# Patient Record
Sex: Female | Born: 1935 | Race: White | Hispanic: No
Health system: Southern US, Community
[De-identification: ages and names within clinical notes are randomized; demographics above are authoritative.]

---

## 2004-10-24 ENCOUNTER — Ambulatory Visit: Payer: Self-pay | Admitting: Internal Medicine

## 2005-12-25 ENCOUNTER — Ambulatory Visit: Payer: Self-pay | Admitting: Internal Medicine

## 2007-01-21 ENCOUNTER — Ambulatory Visit: Payer: Self-pay | Admitting: Internal Medicine

## 2007-02-15 ENCOUNTER — Other Ambulatory Visit: Payer: Self-pay

## 2007-02-15 ENCOUNTER — Inpatient Hospital Stay: Payer: Self-pay | Admitting: Internal Medicine

## 2008-01-23 ENCOUNTER — Ambulatory Visit: Payer: Self-pay | Admitting: Internal Medicine

## 2008-06-16 ENCOUNTER — Ambulatory Visit: Payer: Self-pay | Admitting: Ophthalmology

## 2008-12-28 ENCOUNTER — Ambulatory Visit: Payer: Self-pay | Admitting: Ophthalmology

## 2009-01-24 ENCOUNTER — Ambulatory Visit: Payer: Self-pay | Admitting: Internal Medicine

## 2009-06-07 ENCOUNTER — Ambulatory Visit: Payer: Self-pay | Admitting: Internal Medicine

## 2009-06-08 ENCOUNTER — Ambulatory Visit: Payer: Self-pay | Admitting: Internal Medicine

## 2010-02-16 ENCOUNTER — Ambulatory Visit: Payer: Self-pay | Admitting: Internal Medicine

## 2011-02-19 ENCOUNTER — Ambulatory Visit: Payer: Self-pay | Admitting: Internal Medicine

## 2012-02-20 ENCOUNTER — Ambulatory Visit: Payer: Self-pay | Admitting: Internal Medicine

## 2012-07-08 ENCOUNTER — Ambulatory Visit: Payer: Self-pay

## 2012-07-09 ENCOUNTER — Ambulatory Visit: Payer: Self-pay | Admitting: Oncology

## 2012-07-10 ENCOUNTER — Ambulatory Visit: Payer: Self-pay | Admitting: Oncology

## 2012-07-10 LAB — CBC CANCER CENTER
Eosinophil %: 1.4 %
HGB: 11.6 g/dL — ABNORMAL LOW (ref 12.0–16.0)
Lymphocyte #: 1.4 x10 3/mm (ref 1.0–3.6)
Lymphocyte %: 13.7 %
MCV: 94 fL (ref 80–100)
Monocyte %: 12.6 %
Platelet: 286 x10 3/mm (ref 150–440)
RBC: 3.82 10*6/uL (ref 3.80–5.20)
RDW: 13.2 % (ref 11.5–14.5)
WBC: 10.2 x10 3/mm (ref 3.6–11.0)

## 2012-07-10 LAB — COMPREHENSIVE METABOLIC PANEL
Alkaline Phosphatase: 160 U/L — ABNORMAL HIGH (ref 50–136)
BUN: 15 mg/dL (ref 7–18)
Bilirubin,Total: 0.3 mg/dL (ref 0.2–1.0)
Chloride: 106 mmol/L (ref 98–107)
Co2: 24 mmol/L (ref 21–32)
Creatinine: 0.94 mg/dL (ref 0.60–1.30)
EGFR (African American): >60
EGFR (Non-African Amer.): 55 — ABNORMAL LOW
Osmolality: 282 (ref 275–301)
Potassium: 3.8 mmol/L (ref 3.5–5.1)
SGOT(AST): 10 U/L — ABNORMAL LOW (ref 15–37)
SGPT (ALT): 14 U/L (ref 12–78)

## 2012-07-11 ENCOUNTER — Ambulatory Visit: Payer: Self-pay | Admitting: Oncology

## 2012-07-24 ENCOUNTER — Ambulatory Visit: Payer: Self-pay | Admitting: Urology

## 2012-07-29 ENCOUNTER — Ambulatory Visit: Payer: Self-pay | Admitting: Urology

## 2012-08-10 ENCOUNTER — Ambulatory Visit: Payer: Self-pay | Admitting: Oncology

## 2012-08-13 LAB — CBC CANCER CENTER
Basophil %: 0.3 %
Eosinophil %: 0.4 %
HCT: 35.7 % (ref 35.0–47.0)
HGB: 11.9 g/dL — ABNORMAL LOW (ref 12.0–16.0)
Lymphocyte #: 0.9 x10 3/mm — ABNORMAL LOW (ref 1.0–3.6)
MCHC: 33.2 g/dL (ref 32.0–36.0)
MCV: 92 fL (ref 80–100)
Monocyte %: 12.6 %
Neutrophil #: 8.4 x10 3/mm — ABNORMAL HIGH (ref 1.4–6.5)
RBC: 3.88 10*6/uL (ref 3.80–5.20)
WBC: 10.8 x10 3/mm (ref 3.6–11.0)

## 2012-08-13 LAB — COMPREHENSIVE METABOLIC PANEL
Albumin: 2.8 g/dL — ABNORMAL LOW (ref 3.4–5.0)
Alkaline Phosphatase: 145 U/L — ABNORMAL HIGH (ref 50–136)
Anion Gap: 11 (ref 7–16)
Calcium, Total: 9 mg/dL (ref 8.5–10.1)
Chloride: 102 mmol/L (ref 98–107)
Creatinine: 1.03 mg/dL (ref 0.60–1.30)
Glucose: 118 mg/dL — ABNORMAL HIGH (ref 65–99)
SGOT(AST): 30 U/L (ref 15–37)
SGPT (ALT): 36 U/L (ref 12–78)

## 2012-08-20 LAB — CBC CANCER CENTER
Basophil #: 0 x10 3/mm (ref 0.0–0.1)
Eosinophil #: 0 x10 3/mm (ref 0.0–0.7)
HCT: 36.2 % (ref 35.0–47.0)
HGB: 12.2 g/dL (ref 12.0–16.0)
Lymphocyte %: 24.7 %
MCV: 91 fL (ref 80–100)
Monocyte #: 0.1 x10 3/mm — ABNORMAL LOW (ref 0.2–0.9)
Neutrophil #: 2.2 x10 3/mm (ref 1.4–6.5)
Neutrophil %: 71.8 %
RDW: 13.2 % (ref 11.5–14.5)
WBC: 3.1 x10 3/mm — ABNORMAL LOW (ref 3.6–11.0)

## 2012-08-25 LAB — URINALYSIS, COMPLETE
Bilirubin,UR: NEGATIVE
Glucose,UR: NEGATIVE mg/dL (ref 0–75)
Ph: 6 (ref 4.5–8.0)
RBC,UR: 5151 /HPF (ref 0–5)
Squamous Epithelial: 3
WBC UR: 44 /HPF (ref 0–5)

## 2012-08-25 LAB — COMPREHENSIVE METABOLIC PANEL
Albumin: 2.9 g/dL — ABNORMAL LOW (ref 3.4–5.0)
Alkaline Phosphatase: 120 U/L (ref 50–136)
Anion Gap: 12 (ref 7–16)
Bilirubin,Total: 0.4 mg/dL (ref 0.2–1.0)
Chloride: 100 mmol/L (ref 98–107)
Co2: 26 mmol/L (ref 21–32)
Creatinine: 1 mg/dL (ref 0.60–1.30)
Potassium: 4.1 mmol/L (ref 3.5–5.1)
SGOT(AST): 68 U/L — ABNORMAL HIGH (ref 15–37)
SGPT (ALT): 80 U/L — ABNORMAL HIGH (ref 12–78)
Total Protein: 7.6 g/dL (ref 6.4–8.2)

## 2012-08-25 LAB — CBC CANCER CENTER
Basophil #: 0 x10 3/mm (ref 0.0–0.1)
Basophil %: 0 %
Eosinophil #: 0 x10 3/mm (ref 0.0–0.7)
HCT: 32.5 % — ABNORMAL LOW (ref 35.0–47.0)
HGB: 11 g/dL — ABNORMAL LOW (ref 12.0–16.0)
Lymphocyte %: 74.7 %
MCH: 30.4 pg (ref 26.0–34.0)
MCHC: 33.7 g/dL (ref 32.0–36.0)
Monocyte #: 0 x10 3/mm — ABNORMAL LOW (ref 0.2–0.9)
Neutrophil #: 0.1 x10 3/mm — ABNORMAL LOW (ref 1.4–6.5)
Neutrophil %: 24.1 %
RDW: 13.2 % (ref 11.5–14.5)

## 2012-08-27 LAB — CBC CANCER CENTER
Basophil #: 0 x10 3/mm (ref 0.0–0.1)
Eosinophil #: 0 x10 3/mm (ref 0.0–0.7)
HCT: 31.7 % — ABNORMAL LOW (ref 35.0–47.0)
HGB: 10.6 g/dL — ABNORMAL LOW (ref 12.0–16.0)
MCV: 90 fL (ref 80–100)
Monocyte #: 0.1 x10 3/mm — ABNORMAL LOW (ref 0.2–0.9)
Monocyte %: 3.9 %
Neutrophil %: 73.2 %
Platelet: 8 x10 3/mm — CL (ref 150–440)
RBC: 3.51 10*6/uL — ABNORMAL LOW (ref 3.80–5.20)
RDW: 12.9 % (ref 11.5–14.5)
WBC: 2 x10 3/mm — CL (ref 3.6–11.0)

## 2012-09-01 LAB — CBC CANCER CENTER
HCT: 31.9 % — ABNORMAL LOW (ref 35.0–47.0)
Lymphocytes: 20 %
MCH: 30 pg (ref 26.0–34.0)
MCV: 89 fL (ref 80–100)
Metamyelocyte: 3 %
Monocytes: 11 %
Myelocyte: 10 %
Promyelocyte: 2 %
RBC: 3.57 10*6/uL — ABNORMAL LOW (ref 3.80–5.20)

## 2012-09-10 ENCOUNTER — Ambulatory Visit: Payer: Self-pay | Admitting: Oncology

## 2012-09-17 LAB — CBC CANCER CENTER
Basophil #: 0.1 x10 3/mm (ref 0.0–0.1)
Eosinophil %: 1.1 %
HCT: 34.3 % — ABNORMAL LOW (ref 35.0–47.0)
Lymphocyte #: 1.4 x10 3/mm (ref 1.0–3.6)
Lymphocyte %: 13.7 %
MCH: 30.6 pg (ref 26.0–34.0)
MCV: 93 fL (ref 80–100)
Monocyte %: 19.3 %
Neutrophil %: 64.9 %
Platelet: 388 x10 3/mm (ref 150–440)
RBC: 3.69 10*6/uL — ABNORMAL LOW (ref 3.80–5.20)
RDW: 17.5 % — ABNORMAL HIGH (ref 11.5–14.5)
WBC: 9.9 x10 3/mm (ref 3.6–11.0)

## 2012-09-17 LAB — COMPREHENSIVE METABOLIC PANEL
Alkaline Phosphatase: 134 U/L (ref 50–136)
Anion Gap: 12 (ref 7–16)
BUN: 15 mg/dL (ref 7–18)
Bilirubin,Total: 0.2 mg/dL (ref 0.2–1.0)
Calcium, Total: 8.8 mg/dL (ref 8.5–10.1)
Chloride: 104 mmol/L (ref 98–107)
Co2: 23 mmol/L (ref 21–32)
Creatinine: 1.18 mg/dL (ref 0.60–1.30)
EGFR (African American): 49 — ABNORMAL LOW
EGFR (Non-African Amer.): 42 — ABNORMAL LOW
Osmolality: 279 (ref 275–301)
Potassium: 4.1 mmol/L (ref 3.5–5.1)
SGOT(AST): 23 U/L (ref 15–37)
Sodium: 139 mmol/L (ref 136–145)
Total Protein: 7.2 g/dL (ref 6.4–8.2)

## 2012-09-22 ENCOUNTER — Ambulatory Visit: Payer: Self-pay | Admitting: Surgery

## 2012-09-23 ENCOUNTER — Ambulatory Visit: Payer: Self-pay | Admitting: Surgery

## 2012-09-24 LAB — CBC CANCER CENTER
Basophil #: 0 x10 3/mm (ref 0.0–0.1)
Basophil %: 1.3 %
Eosinophil %: 2.2 %
Lymphocyte #: 0.7 x10 3/mm — ABNORMAL LOW (ref 1.0–3.6)
Lymphocyte %: 41.7 %
MCV: 93 fL (ref 80–100)
Neutrophil #: 0.9 x10 3/mm — ABNORMAL LOW (ref 1.4–6.5)
Neutrophil %: 49.1 %

## 2012-10-01 LAB — CBC CANCER CENTER
Eosinophil %: 0.4 %
HCT: 27.5 % — ABNORMAL LOW (ref 35.0–47.0)
Lymphocyte #: 0.6 x10 3/mm — ABNORMAL LOW (ref 1.0–3.6)
MCH: 31.7 pg (ref 26.0–34.0)
MCHC: 34 g/dL (ref 32.0–36.0)
Monocyte %: 20.1 %
Neutrophil #: 3 x10 3/mm (ref 1.4–6.5)
Neutrophil %: 65.1 %
Platelet: 39 x10 3/mm — ABNORMAL LOW (ref 150–440)
WBC: 4.6 x10 3/mm (ref 3.6–11.0)

## 2012-10-09 LAB — COMPREHENSIVE METABOLIC PANEL
Albumin: 2.7 g/dL — ABNORMAL LOW (ref 3.4–5.0)
Alkaline Phosphatase: 148 U/L — ABNORMAL HIGH (ref 50–136)
Anion Gap: 11 (ref 7–16)
Bilirubin,Total: 0.3 mg/dL (ref 0.2–1.0)
Calcium, Total: 8.7 mg/dL (ref 8.5–10.1)
Chloride: 99 mmol/L (ref 98–107)
Creatinine: 0.89 mg/dL (ref 0.60–1.30)
EGFR (African American): >60
EGFR (Non-African Amer.): 59 — ABNORMAL LOW
Osmolality: 277 (ref 275–301)
SGPT (ALT): 23 U/L (ref 12–78)

## 2012-10-09 LAB — CBC CANCER CENTER
Basophil #: 0 x10 3/mm (ref 0.0–0.1)
Eosinophil %: 0.3 %
HCT: 29.7 % — ABNORMAL LOW (ref 35.0–47.0)
HGB: 10 g/dL — ABNORMAL LOW (ref 12.0–16.0)
Lymphocyte #: 1 x10 3/mm (ref 1.0–3.6)
Lymphocyte %: 14.1 %
MCH: 32 pg (ref 26.0–34.0)
MCHC: 33.5 g/dL (ref 32.0–36.0)
MCV: 95 fL (ref 80–100)
Monocyte %: 12.9 %
Neutrophil %: 72.1 %
RDW: 20.7 % — ABNORMAL HIGH (ref 11.5–14.5)
WBC: 7.1 x10 3/mm (ref 3.6–11.0)

## 2012-10-11 ENCOUNTER — Ambulatory Visit: Payer: Self-pay | Admitting: Oncology

## 2012-10-16 LAB — CBC CANCER CENTER
Basophil #: 0 x10 3/mm (ref 0.0–0.1)
Basophil %: 0.4 %
Eosinophil #: 0 x10 3/mm (ref 0.0–0.7)
Eosinophil %: 0.8 %
Lymphocyte #: 1.2 x10 3/mm (ref 1.0–3.6)
MCHC: 33.9 g/dL (ref 32.0–36.0)
Monocyte #: 0.5 x10 3/mm (ref 0.2–0.9)
Monocyte %: 9.5 %
Neutrophil %: 67 %
Platelet: 35 x10 3/mm — ABNORMAL LOW (ref 150–440)
RBC: 2.91 10*6/uL — ABNORMAL LOW (ref 3.80–5.20)
RDW: 21.2 % — ABNORMAL HIGH (ref 11.5–14.5)

## 2012-10-16 LAB — COMPREHENSIVE METABOLIC PANEL
Anion Gap: 13 (ref 7–16)
BUN: 13 mg/dL (ref 7–18)
Calcium, Total: 8.6 mg/dL (ref 8.5–10.1)
Chloride: 103 mmol/L (ref 98–107)
Creatinine: 0.96 mg/dL (ref 0.60–1.30)
EGFR (Non-African Amer.): 54 — ABNORMAL LOW
Glucose: 130 mg/dL — ABNORMAL HIGH (ref 65–99)
Potassium: 3.8 mmol/L (ref 3.5–5.1)
SGOT(AST): 23 U/L (ref 15–37)
SGPT (ALT): 20 U/L (ref 12–78)

## 2012-10-27 ENCOUNTER — Emergency Department: Payer: Self-pay | Admitting: Emergency Medicine

## 2012-10-27 LAB — COMPREHENSIVE METABOLIC PANEL
Albumin: 2.9 g/dL — ABNORMAL LOW (ref 3.4–5.0)
Alkaline Phosphatase: 121 U/L (ref 50–136)
BUN: 13 mg/dL (ref 7–18)
Bilirubin,Total: 0.5 mg/dL (ref 0.2–1.0)
Calcium, Total: 9 mg/dL (ref 8.5–10.1)
Chloride: 105 mmol/L (ref 98–107)
Co2: 19 mmol/L — ABNORMAL LOW (ref 21–32)
Creatinine: 0.87 mg/dL (ref 0.60–1.30)
Sodium: 137 mmol/L (ref 136–145)
Total Protein: 8 g/dL (ref 6.4–8.2)

## 2012-10-27 LAB — CBC WITH DIFFERENTIAL/PLATELET
Basophil %: 0.4 %
Eosinophil #: 0 10*3/uL (ref 0.0–0.7)
Eosinophil %: 0.3 %
MCH: 32.9 pg (ref 26.0–34.0)
MCHC: 32.7 g/dL (ref 32.0–36.0)
MCV: 101 fL — ABNORMAL HIGH (ref 80–100)
Monocyte #: 0.6 x10 3/mm (ref 0.2–0.9)
Monocyte %: 13.6 %
Platelet: 55 10*3/uL — ABNORMAL LOW (ref 150–440)
RDW: 21.7 % — ABNORMAL HIGH (ref 11.5–14.5)
WBC: 4.6 10*3/uL (ref 3.6–11.0)

## 2012-10-27 LAB — LIPASE, BLOOD: Lipase: 64 U/L — ABNORMAL LOW (ref 73–393)

## 2012-10-30 LAB — COMPREHENSIVE METABOLIC PANEL
Albumin: 2.6 g/dL — ABNORMAL LOW (ref 3.4–5.0)
Anion Gap: 14 (ref 7–16)
BUN: 15 mg/dL (ref 7–18)
Bilirubin,Total: 0.2 mg/dL (ref 0.2–1.0)
Calcium, Total: 8.6 mg/dL (ref 8.5–10.1)
Co2: 20 mmol/L — ABNORMAL LOW (ref 21–32)
Glucose: 88 mg/dL (ref 65–99)
Potassium: 3.6 mmol/L (ref 3.5–5.1)
SGOT(AST): 36 U/L (ref 15–37)
SGPT (ALT): 31 U/L (ref 12–78)
Sodium: 138 mmol/L (ref 136–145)
Total Protein: 7.1 g/dL (ref 6.4–8.2)

## 2012-10-30 LAB — CBC CANCER CENTER
Basophil %: 0.7 %
HGB: 8.9 g/dL — ABNORMAL LOW (ref 12.0–16.0)
Lymphocyte #: 1.2 x10 3/mm (ref 1.0–3.6)
Lymphocyte %: 15.5 %
MCH: 33.8 pg (ref 26.0–34.0)
MCHC: 33.7 g/dL (ref 32.0–36.0)
MCV: 100 fL (ref 80–100)
Monocyte %: 11.1 %
Neutrophil #: 5.7 x10 3/mm (ref 1.4–6.5)
Platelet: 61 x10 3/mm — ABNORMAL LOW (ref 150–440)
RBC: 2.65 10*6/uL — ABNORMAL LOW (ref 3.80–5.20)

## 2012-11-01 ENCOUNTER — Emergency Department: Payer: Self-pay | Admitting: Emergency Medicine

## 2012-11-01 LAB — CBC WITH DIFFERENTIAL/PLATELET
Basophil %: 0.6 %
HGB: 9.1 g/dL — ABNORMAL LOW (ref 12.0–16.0)
Lymphocyte %: 17 %
MCHC: 33.1 g/dL (ref 32.0–36.0)
MCV: 102 fL — ABNORMAL HIGH (ref 80–100)
Monocyte #: 0.6 x10 3/mm (ref 0.2–0.9)
Neutrophil #: 3.5 10*3/uL (ref 1.4–6.5)
Platelet: 52 10*3/uL — ABNORMAL LOW (ref 150–440)
RDW: 21.3 % — ABNORMAL HIGH (ref 11.5–14.5)
WBC: 5 10*3/uL (ref 3.6–11.0)

## 2012-11-01 LAB — COMPREHENSIVE METABOLIC PANEL
Chloride: 111 mmol/L — ABNORMAL HIGH (ref 98–107)
Co2: 21 mmol/L (ref 21–32)
Creatinine: 0.8 mg/dL (ref 0.60–1.30)
EGFR (African American): >60
Glucose: 93 mg/dL (ref 65–99)
Osmolality: 280 (ref 275–301)
Potassium: 3.6 mmol/L (ref 3.5–5.1)
Sodium: 141 mmol/L (ref 136–145)
Total Protein: 6.6 g/dL (ref 6.4–8.2)

## 2012-11-08 ENCOUNTER — Ambulatory Visit: Payer: Self-pay | Admitting: Oncology

## 2012-11-10 LAB — URINALYSIS, COMPLETE
Bacteria: NONE SEEN
Bilirubin,UR: NEGATIVE
Glucose,UR: NEGATIVE mg/dL (ref 0–75)
Nitrite: NEGATIVE
Ph: 7 (ref 4.5–8.0)
Specific Gravity: 1.013 (ref 1.003–1.030)
Squamous Epithelial: <1

## 2012-11-10 LAB — COMPREHENSIVE METABOLIC PANEL
Alkaline Phosphatase: 103 U/L (ref 50–136)
Calcium, Total: 8.2 mg/dL — ABNORMAL LOW (ref 8.5–10.1)
Chloride: 106 mmol/L (ref 98–107)
Creatinine: 0.72 mg/dL (ref 0.60–1.30)
EGFR (African American): >60
EGFR (Non-African Amer.): >60
Glucose: 91 mg/dL (ref 65–99)
Osmolality: 274 (ref 275–301)
SGOT(AST): 14 U/L — ABNORMAL LOW (ref 15–37)
SGPT (ALT): 28 U/L (ref 12–78)
Total Protein: 6.7 g/dL (ref 6.4–8.2)

## 2012-11-10 LAB — CBC
HCT: 29.3 % — ABNORMAL LOW (ref 35.0–47.0)
HGB: 9.8 g/dL — ABNORMAL LOW (ref 12.0–16.0)
MCH: 34.5 pg — ABNORMAL HIGH (ref 26.0–34.0)
MCHC: 33.4 g/dL (ref 32.0–36.0)
Platelet: 66 10*3/uL — ABNORMAL LOW (ref 150–440)
RBC: 2.83 10*6/uL — ABNORMAL LOW (ref 3.80–5.20)
RDW: 18.9 % — ABNORMAL HIGH (ref 11.5–14.5)
WBC: 7.6 10*3/uL (ref 3.6–11.0)

## 2012-11-12 LAB — TROPONIN I: Troponin-I: 0.04 ng/mL

## 2012-11-12 LAB — CBC WITH DIFFERENTIAL/PLATELET
Basophil #: 0 10*3/uL (ref 0.0–0.1)
Lymphocyte #: 1.2 10*3/uL (ref 1.0–3.6)
Lymphocyte %: 19.2 %
MCH: 34.3 pg — ABNORMAL HIGH (ref 26.0–34.0)
MCHC: 33.2 g/dL (ref 32.0–36.0)
MCV: 103 fL — ABNORMAL HIGH (ref 80–100)
Monocyte %: 11.5 %
Neutrophil %: 68.5 %
Platelet: 60 10*3/uL — ABNORMAL LOW (ref 150–440)
RBC: 2.13 10*6/uL — ABNORMAL LOW (ref 3.80–5.20)
RDW: 19.1 % — ABNORMAL HIGH (ref 11.5–14.5)

## 2012-11-12 LAB — BASIC METABOLIC PANEL
Anion Gap: 6 — ABNORMAL LOW (ref 7–16)
BUN: 15 mg/dL (ref 7–18)
Chloride: 113 mmol/L — ABNORMAL HIGH (ref 98–107)
Co2: 22 mmol/L (ref 21–32)
Creatinine: 0.87 mg/dL (ref 0.60–1.30)
Potassium: 3.8 mmol/L (ref 3.5–5.1)

## 2012-11-13 ENCOUNTER — Inpatient Hospital Stay: Payer: Self-pay | Admitting: Internal Medicine

## 2012-11-13 LAB — CBC WITH DIFFERENTIAL/PLATELET
Basophil #: 0 10*3/uL (ref 0.0–0.1)
Eosinophil #: 0 10*3/uL (ref 0.0–0.7)
Eosinophil %: 0.1 %
Lymphocyte #: 0.7 10*3/uL — ABNORMAL LOW (ref 1.0–3.6)
MCH: 33.2 pg (ref 26.0–34.0)
MCHC: 32 g/dL (ref 32.0–36.0)
MCV: 104 fL — ABNORMAL HIGH (ref 80–100)
Neutrophil #: 4.6 10*3/uL (ref 1.4–6.5)
Platelet: 53 10*3/uL — ABNORMAL LOW (ref 150–440)
RDW: 18.9 % — ABNORMAL HIGH (ref 11.5–14.5)

## 2012-11-13 LAB — BASIC METABOLIC PANEL
Anion Gap: 6 — ABNORMAL LOW (ref 7–16)
Creatinine: 0.9 mg/dL (ref 0.60–1.30)
EGFR (African American): >60
Glucose: 98 mg/dL (ref 65–99)
Osmolality: 288 (ref 275–301)
Potassium: 3.7 mmol/L (ref 3.5–5.1)

## 2012-11-14 LAB — CBC WITH DIFFERENTIAL/PLATELET
Basophil #: 0 10*3/uL (ref 0.0–0.1)
Basophil %: 0.2 %
Eosinophil #: 0 10*3/uL (ref 0.0–0.7)
Eosinophil %: 0.2 %
HCT: 31.6 % — ABNORMAL LOW (ref 35.0–47.0)
MCHC: 33.6 g/dL (ref 32.0–36.0)
Monocyte #: 1 x10 3/mm — ABNORMAL HIGH (ref 0.2–0.9)
Neutrophil #: 5.6 10*3/uL (ref 1.4–6.5)
Platelet: 55 10*3/uL — ABNORMAL LOW (ref 150–440)
RBC: 3.21 10*6/uL — ABNORMAL LOW (ref 3.80–5.20)
RDW: 19.2 % — ABNORMAL HIGH (ref 11.5–14.5)

## 2012-11-14 LAB — BASIC METABOLIC PANEL
BUN: 14 mg/dL (ref 7–18)
Calcium, Total: 7.9 mg/dL — ABNORMAL LOW (ref 8.5–10.1)
Chloride: 112 mmol/L — ABNORMAL HIGH (ref 98–107)
Co2: 20 mmol/L — ABNORMAL LOW (ref 21–32)
EGFR (African American): >60
EGFR (Non-African Amer.): >60

## 2012-11-15 LAB — BASIC METABOLIC PANEL
Co2: 26 mmol/L (ref 21–32)
Osmolality: 286 (ref 275–301)
Sodium: 143 mmol/L (ref 136–145)

## 2012-11-15 LAB — CBC WITH DIFFERENTIAL/PLATELET
Basophil %: 0.7 %
HCT: 32.7 % — ABNORMAL LOW (ref 35.0–47.0)
Lymphocyte %: 13.3 %
MCH: 34.3 pg — ABNORMAL HIGH (ref 26.0–34.0)
MCHC: 34.5 g/dL (ref 32.0–36.0)
MCV: 99 fL (ref 80–100)
Monocyte %: 11.8 %
Neutrophil #: 5.6 10*3/uL (ref 1.4–6.5)
Neutrophil %: 73.9 %
Platelet: 56 10*3/uL — ABNORMAL LOW (ref 150–440)
RBC: 3.3 10*6/uL — ABNORMAL LOW (ref 3.80–5.20)
RDW: 19.3 % — ABNORMAL HIGH (ref 11.5–14.5)
WBC: 7.6 10*3/uL (ref 3.6–11.0)

## 2012-11-18 ENCOUNTER — Ambulatory Visit: Payer: Self-pay | Admitting: Oncology

## 2012-12-09 ENCOUNTER — Ambulatory Visit: Payer: Self-pay | Admitting: Oncology

## 2012-12-09 DEATH — deceased

## 2013-06-07 IMAGING — CT CT CHEST-ABD W/ CM
1 of 3 series · 11 of 30 positions shown, 17 images · non-contrast
Comparison: none

REASON FOR EXAM: Kidney mass   pain  to include kidneys  delayed images
COMMENTS:

[Series 2: soft tissue · axial · 0.63mm/px · z∈[-550,-230]mm · 11 of 80 slices shown, 17 images]
[im 8/80  mediastinal]
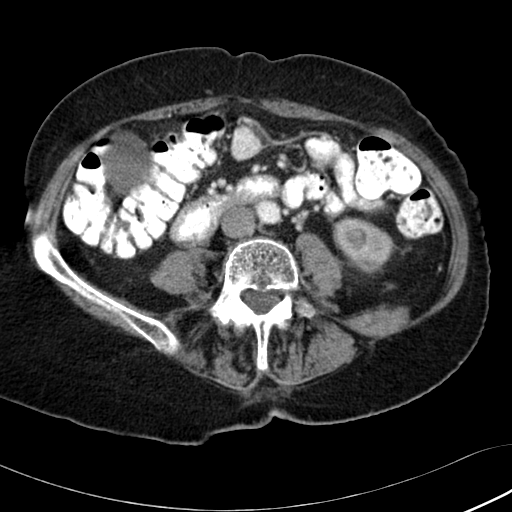
[im 8/80  bone]
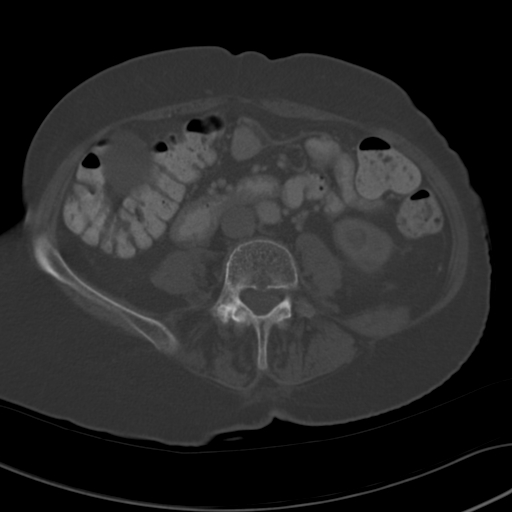
[im 15/80  mediastinal]
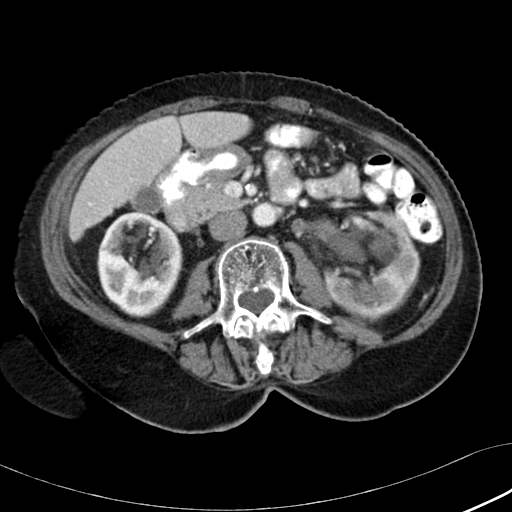
[im 22/80  mediastinal]
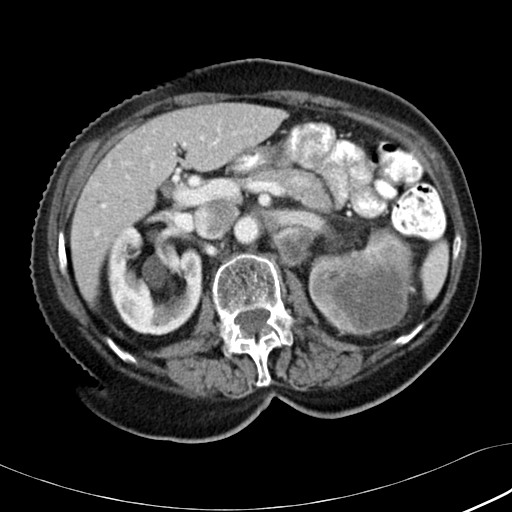
[im 29/80  mediastinal]
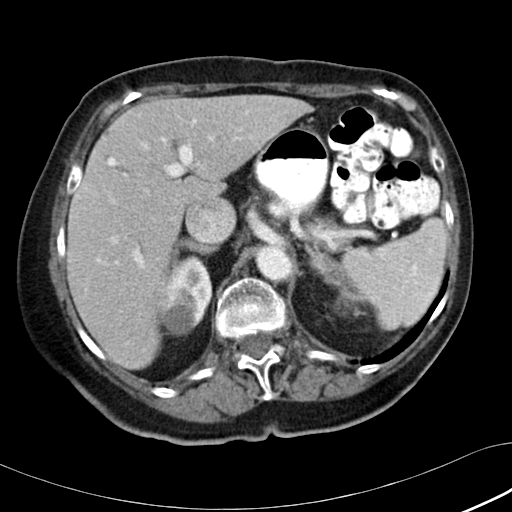
[im 36/80  mediastinal]
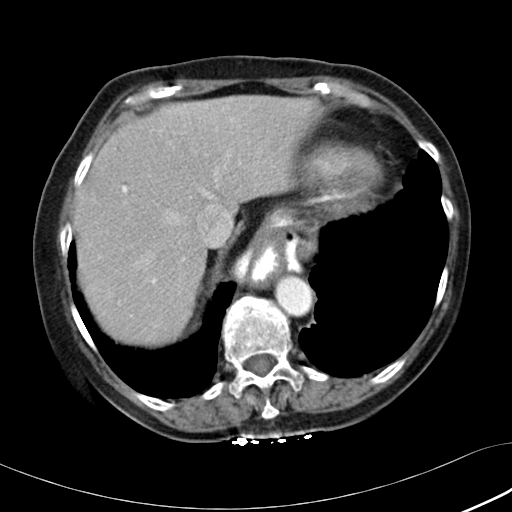
[im 39/80  mediastinal]
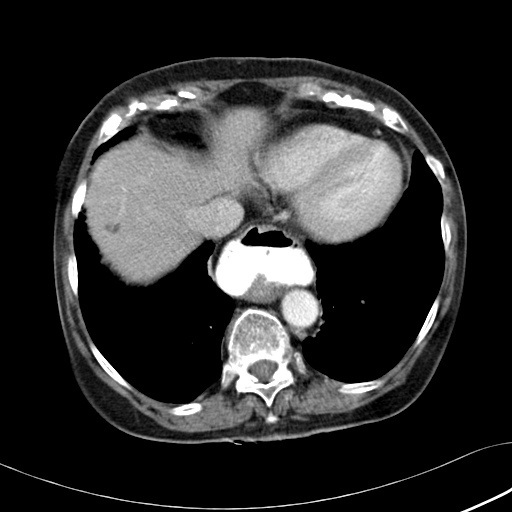
[im 44/80  mediastinal]
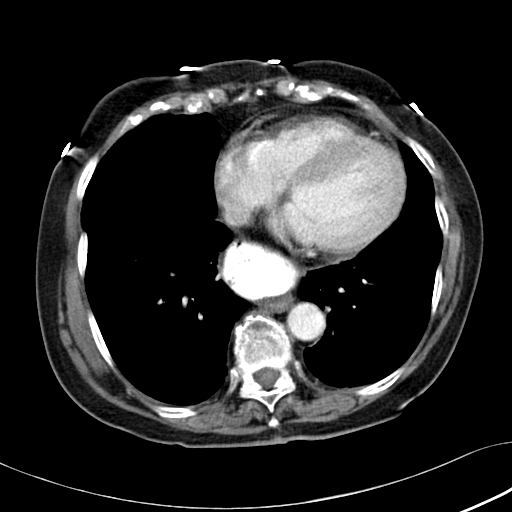
[im 51/80  mediastinal]
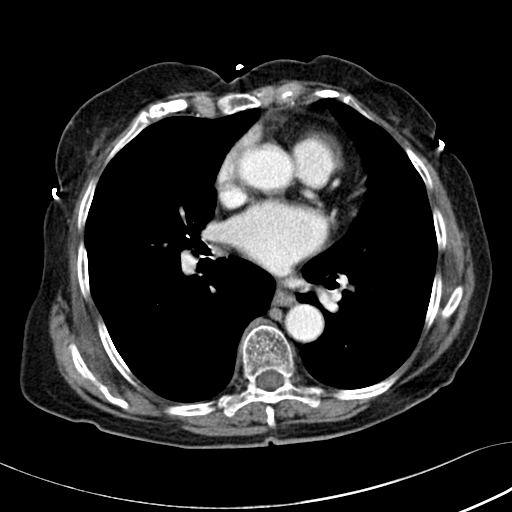
[im 51/80  lung]
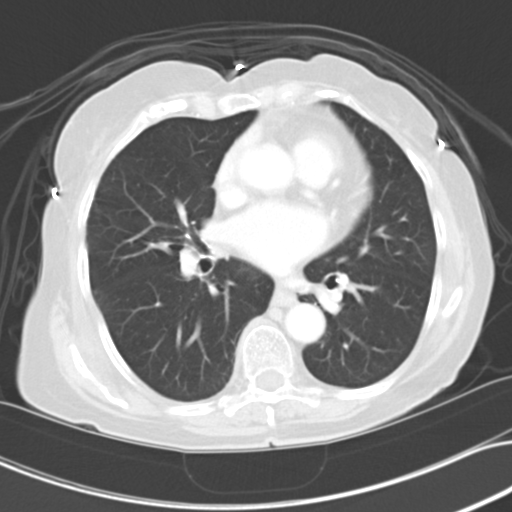
[im 58/80  mediastinal]
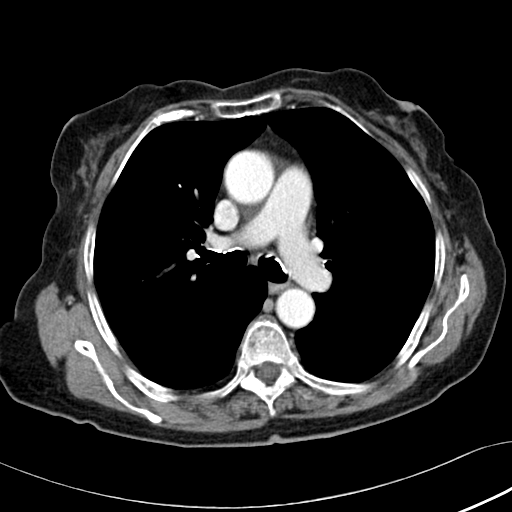
[im 58/80  lung]
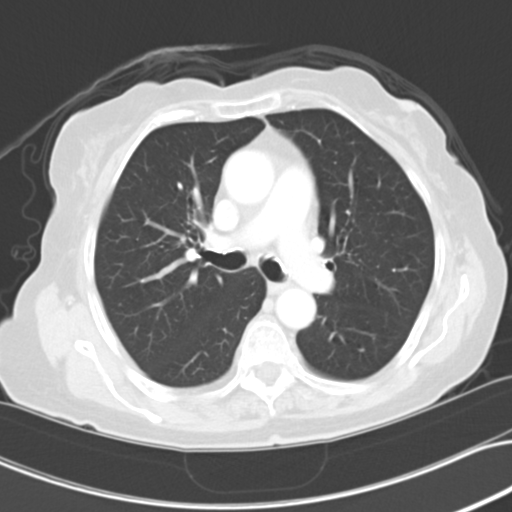
[im 58/80  bone]
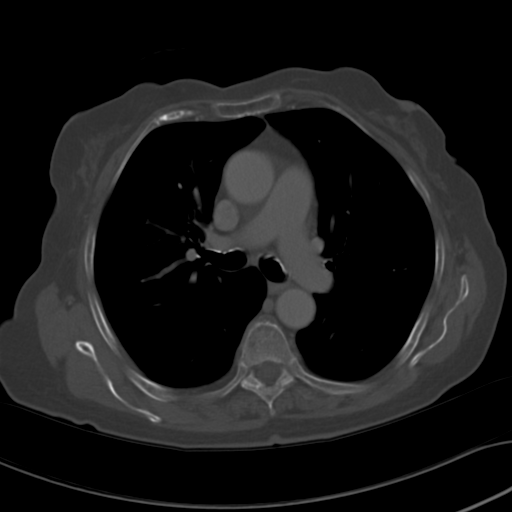
[im 65/80  mediastinal]
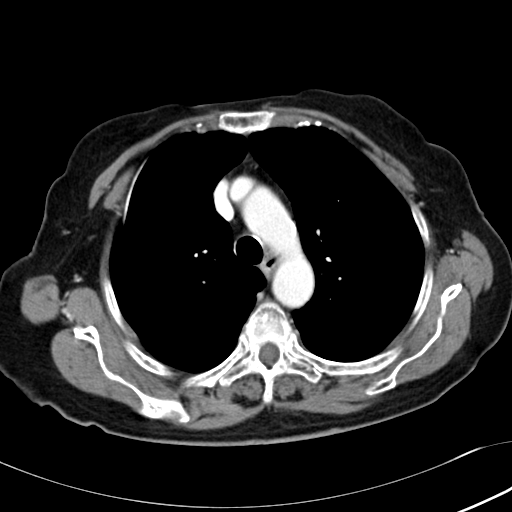
[im 65/80  lung]
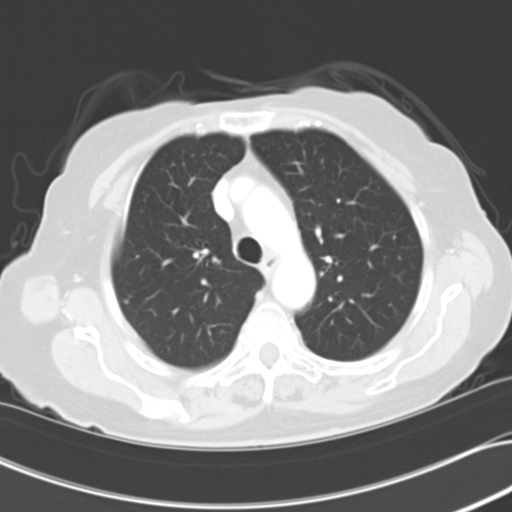
[im 72/80  mediastinal]
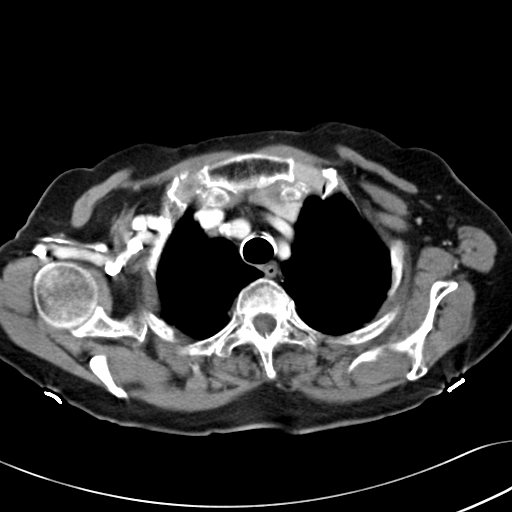
[im 72/80  lung]
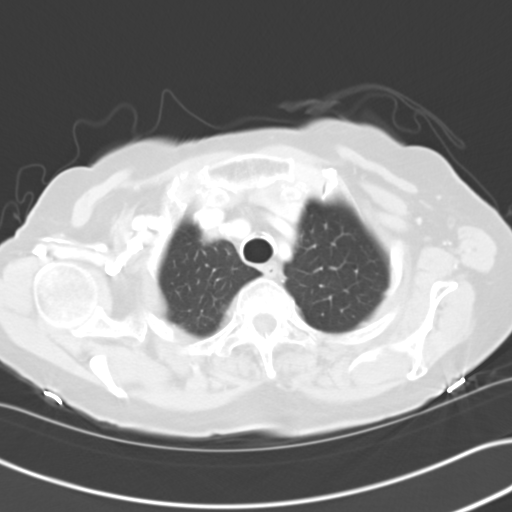

[11 of 30 positions shown; findings below may reference images not displayed]

PROCEDURE:     CT  - CT CHEST AND ABDOMEN W  - July 14, 2012  [DATE]

RESULT:     Axial CT scanning was performed through the chest and abdomen
with reconstructions at 3 mm intervals and slice thicknesses. Comparison is
made to study 08 July, 2012 which is of the abdomen only. There is
comparison is also made to study 07 June, 2009.

CT scan of the chest: The cardiac chambers are normal in size. The caliber
of the thoracic aorta is normal. There are no pathologic sized mediastinal
or hilar lymph nodes. There is a small hiatal hernia. On image 16 there is
an approximately 2 to 3 mm diameter subpleural nodule in the right upper
lobe laterally. More inferiorly in the lateral aspect of the right middle
lobe there is approximately 2 x 4 mm diameter nodule. More posteriorly
adjacent to the major fissure in the right middle lobe there is a 2 mm
diameter nodule. On the left associated with the major fissure on image 32
there is a 2 mm diameter nodule. There is no interstitial nor alveolar
infiltrate. There is a small amount of apical pleural scarring. There is a
hiatal hernia-partially intrathoracic stomach.
CONCLUSION: 1. There are multiple tiny nodules within the lungs which are nonspecific.
None exceed 3 mm in diameter. These are likely inflammatory but would will
merit followup chest CT scans.
2. There are no pathologic sized mediastinal or hilar lymph nodes.
3. There is a moderate sized hiatal hernia gas partially intrathoracic
stomach.

CT scan of the abdomen: There is a markedly abnormal appearance of the left
kidney worrisome for malignancy or less likely multifocal infection. There
is a satellite nodule inferior to the spleen on image 55 which measures
approximately 1.5 centimeters in diameter. The left kidney appears expanded.
There are some cystic changes in the parapelvic regions but a poorly defined
mid pole mass is present measuring as much as 5 cm in diameter. The right
kidney exhibits multiple parapelvic cysts. The cortical parenchyma enhances
well. There is an exophytic cyst from the upper pole of the right kidney.

There is bulky periaortic lymphadenopathy on the left. These lymph nodes
measure between 1.5 in 2.5 cm in diameter. The liver exhibits a tiny stable
hypodensity in the right lobe near the dome consistent with a cyst. The
stomach is partially intrathoracic. The spleen, pancreas, and adrenal glands
exhibit no acute abnormality. The caliber of the abdominal aorta is normal.
The of partially visualized small and large bowel exhibits no acute
abnormality.
IMPRESSION: 1. Please see the discussion above regarding findings in the thorax.
2. There is a markedly abnormal appearance of the kidney consistent with
malignancy. There is retroperitoneal lymphadenopathy and mesenteric
lymphadenopathy.
3. I do not see acute abnormality elsewhere within the abdomen.

## 2013-10-05 IMAGING — CT CT CHEST W/ CM
1 of 2 series · 14 of 32 positions shown, 18 images · non-contrast
Comparison: none

REASON FOR EXAM: tachycardia
COMMENTS:

[Series 5: lung windows · axial · 0.58mm/px · z∈[-40,+194]mm · 14 of 94 slices shown, 18 images]
[im 8/94  mediastinal]
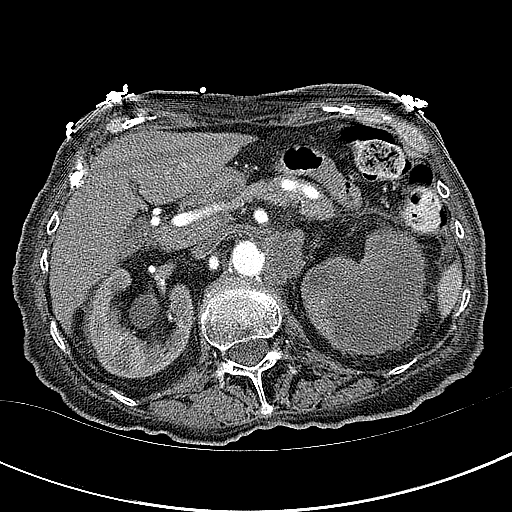
[im 8/94  lung]
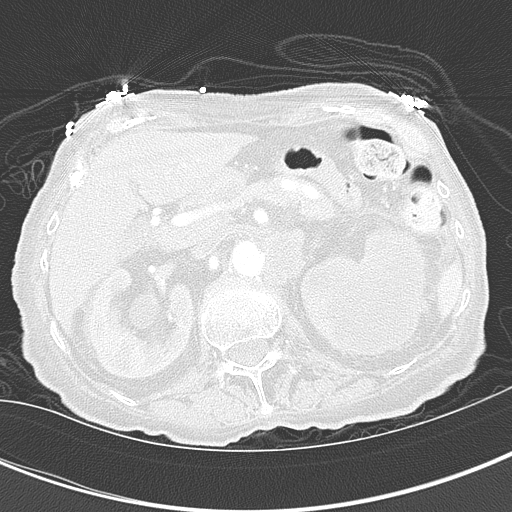
[im 15/94  lung]
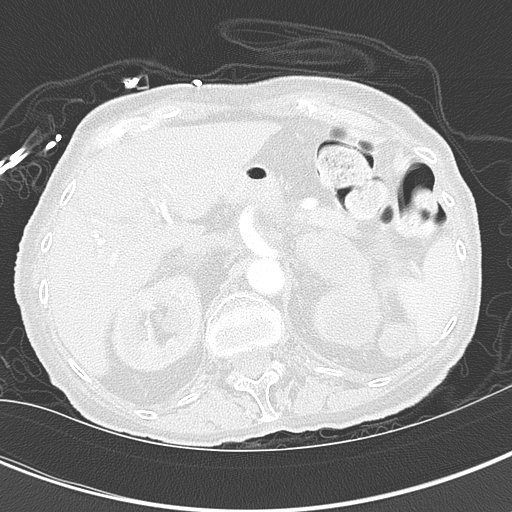
[im 22/94  lung]
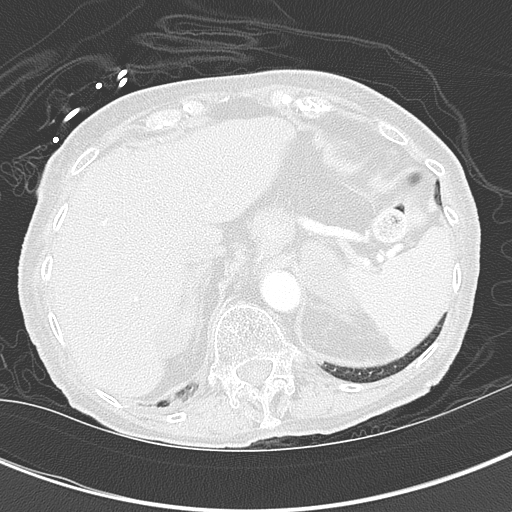
[im 29/94  lung]
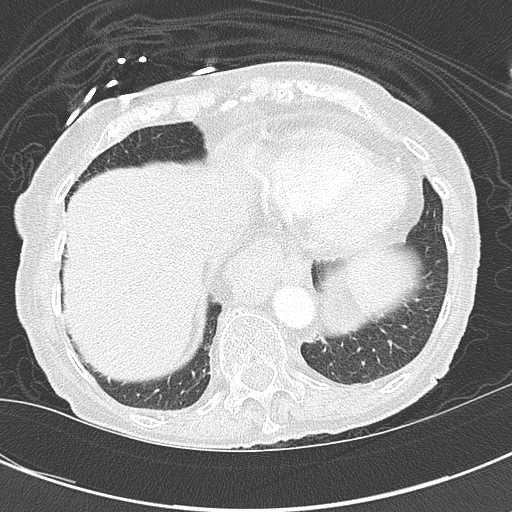
[im 36/94  mediastinal]
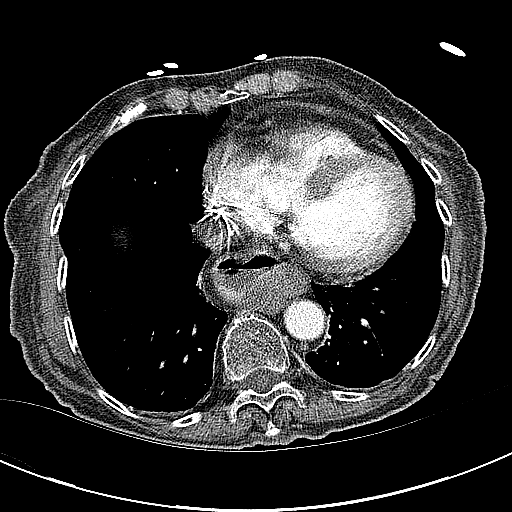
[im 36/94  lung]
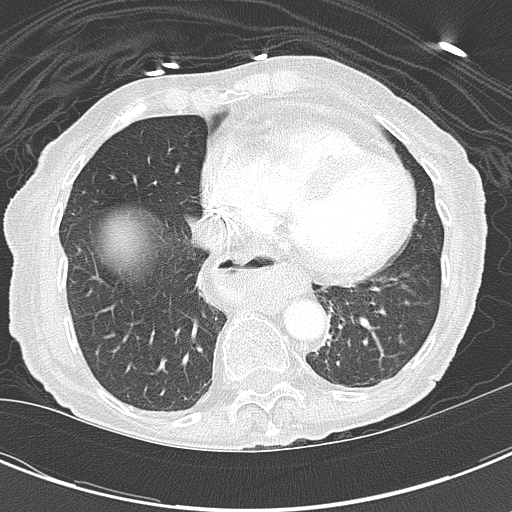
[im 43/94  lung]
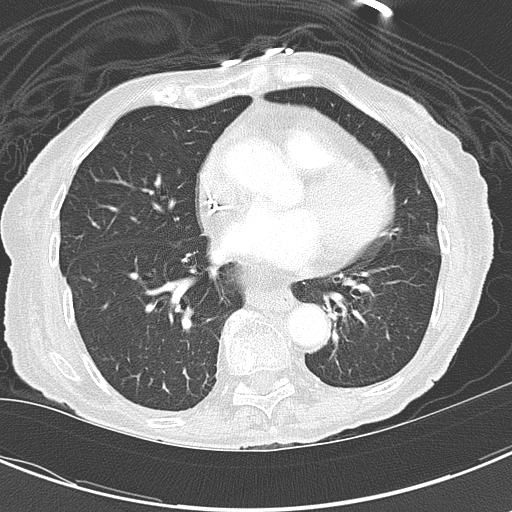
[im 44/94  lung]
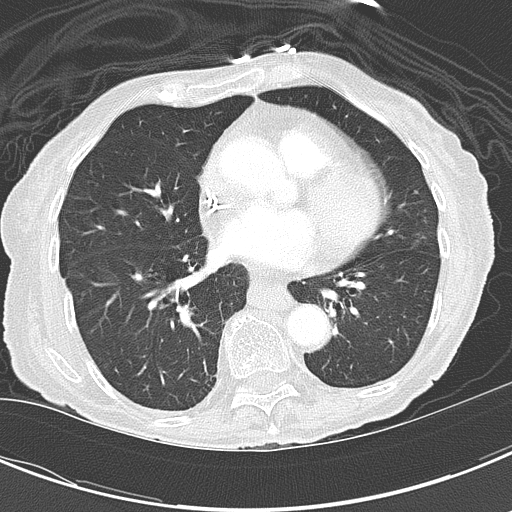
[im 47/94  lung]
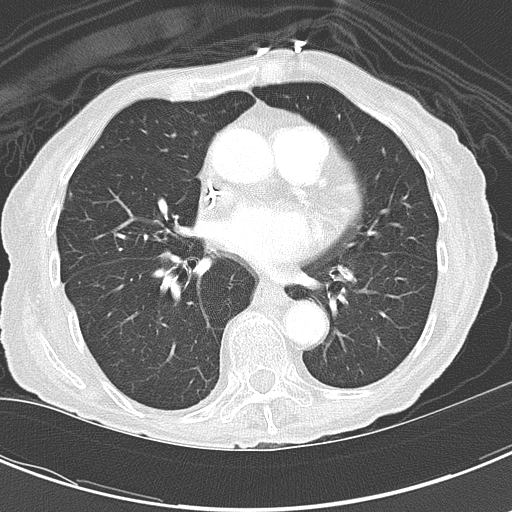
[im 51/94  mediastinal]
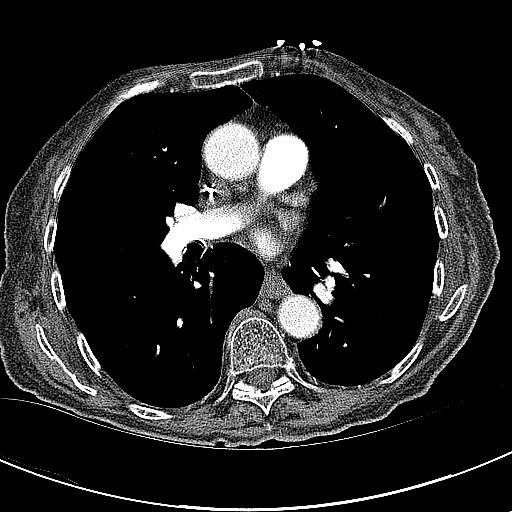
[im 51/94  lung]
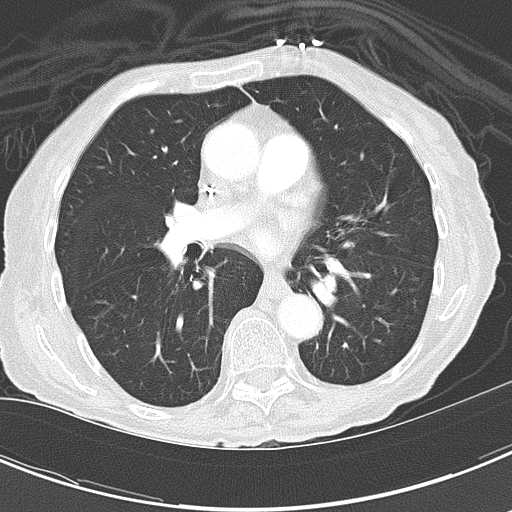
[im 58/94  lung]
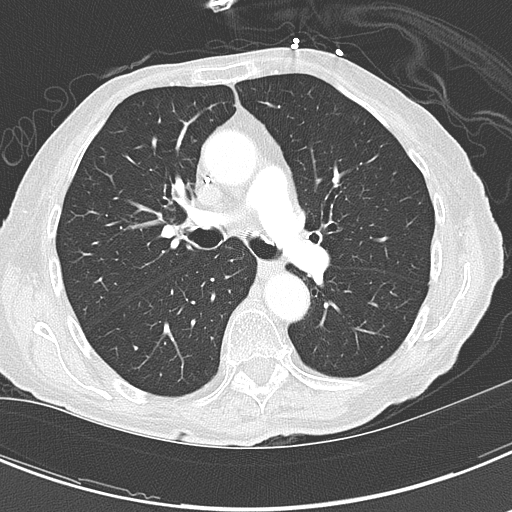
[im 65/94  lung]
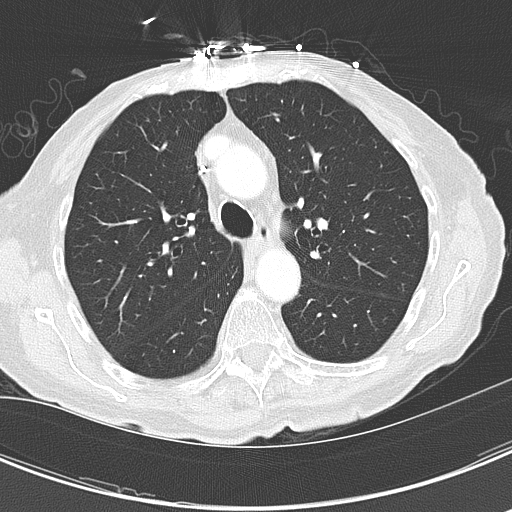
[im 72/94  lung]
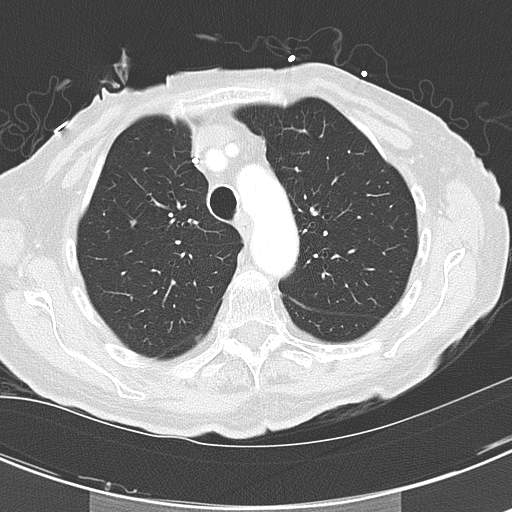
[im 79/94  mediastinal]
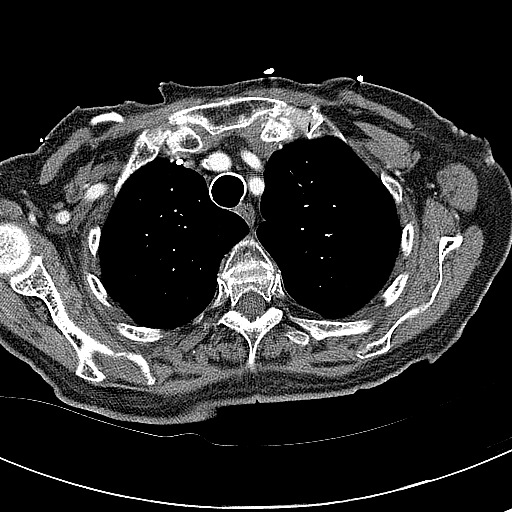
[im 79/94  lung]
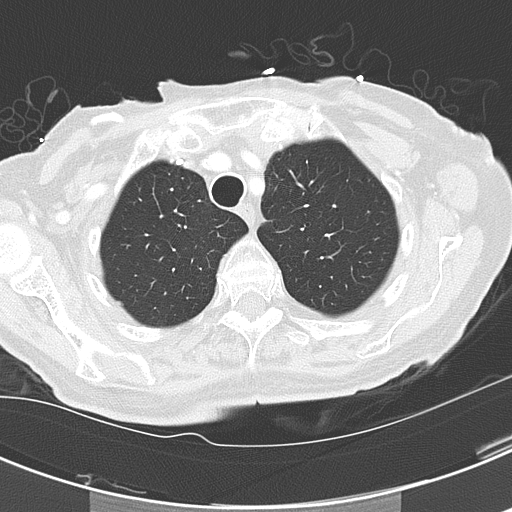
[im 86/94  lung]
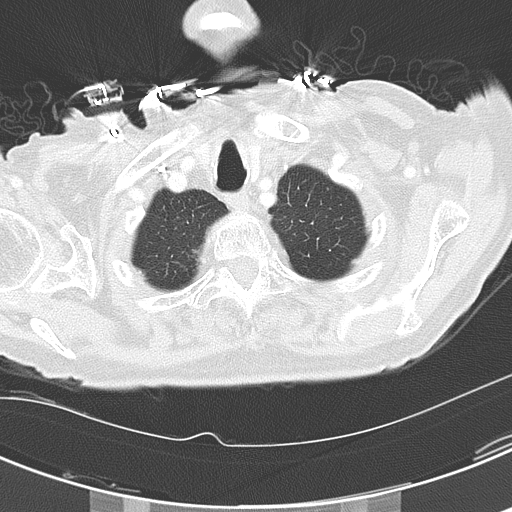

[14 of 32 positions shown; findings below may reference images not displayed]

PROCEDURE:     CT  - CT CHEST (FOR PE) W  - November 11, 2012  [DATE]

RESULT:     Chest CT is performed with 75 mL of Lsovue-0PW iodinated
intravenous contrast and reconstructed at 3 mm slice thickness in the axial
plane with comparison made to the previous exam of 14 July, 2012. There is
also a previous CT of the abdomen and pelvis from 14 July, 2012.

Severe left hydronephrosis is present with renal cortical thinning. There is
prominence of the right renal collecting system as well. This is
incompletely evaluated on the current study.

There is a moderately large hiatal hernia present. The thoracic aorta is
normal in caliber without dissection. The pulmonary arterial system appears
to opacify normally without a filling defect to suggest pulmonary embolism.
There is no pleural or pericardial effusion. There is no mediastinal or
hilar mass or adenopathy. There is no supraclavicular or axillary
adenopathy. There appear to be enlarged nodes in the left para-aortic region
surrounding the left renal artery. A Port-A-Cath device is present over the
right chest. Bony structures show no acute abnormality. Lung window images
demonstrate some minimal subpleural linear density best appreciated on image
#21. This appears to be calcified and is likely granulomatous in origin.
There is some minimal thickening along the minor fissure appreciated in the
area of image #47. The lungs are clear without a discrete mass, evidence of
edema or infiltrate. Bilateral apical fibrotic changes are present and
similar to that seen on the previous exam mentioned above.
IMPRESSION: 1. No pulmonary embolic filling defect evident.
2. Moderately large hiatal hernia.
3. Degenerative changes in the spine.
4. Probable right upper lobe granulomatous calcification. Bilateral apical
densities, likely fibrotic.
5. Left hydronephrosis that is severe and chronic. Findings concerning for
right hydronephrosis. Please see the separate CT of the abdomen and pelvis.
Probable left para-aortic adenopathy. Please see the separate dictation of
the abdomen and pelvis.

[REDACTED]

## 2014-12-28 NOTE — Op Note (Signed)
PATIENT NAME:  Jennifer Kidd, Jennifer Kidd MR#:  [id number] DATE OF BIRTH:  1925-03-23  DATE OF PROCEDURE:  07/29/2012  PREOPERATIVE DIAGNOSIS: Left renal pelvic mass.   POSTOPERATIVE DIAGNOSIS: Left renal pelvic mass.  PROCEDURE: Left ureteroscopy with biopsy, renal pelvic washings.   SURGEON: Madolyn FriezeBrian S. Achilles Dunkope, MD  ANESTHESIA: General endotracheal anesthesia.   INDICATIONS: The patient is an 81 year old white female who initially presented with gross hematuria. She has developed progressive left-sided flank pain and discomfort. A recent CT scan demonstrated what appears to be an obstructed kidney with some degree of hydronephrosis. There is an enhancing mass in the region of the left renal pelvis demonstrating obstruction. She also has several periaortic lymph nodes suspicious for metastatic disease. She presents for ureteroscopy and biopsy.   DESCRIPTION OF PROCEDURE: After informed consent was obtained, the patient was taken to the Operating Room and placed in the dorsal lithotomy position under general endotracheal anesthesia. The patient was then prepped and draped in the usual standard fashion. The 22 French rigid cystoscope was introduced into the urethra under direct vision with no urethral abnormalities noted. Upon entering the bladder, the mucosa was inspected in its entirety with no gross mucosal lesions noted. Some blood was noted layering on the bladder base. This was irrigated free with no lesions noted. Bilateral ureteral orifices were well visualized with no lesions noted. A flexible tip Glidewire was introduced into the left ureteral orifice. A 6 French open-ended catheter was advanced over the guidewire into the mid ureter. A retrograde pyelogram was performed demonstrating a large filling defect throughout the entire renal pelvis. The outer calyces were minimally visualized. The guidewire was readvanced through the open-ended catheter. The open-ended catheter was then removed. The cystoscope was  removed. The 6 French rigid ureteroscope was advanced into the urinary bladder. It was advanced into the left ureteral orifice and to the proximal left ureter under direct visualization at the level of the UPJ. A large papillary-appearing tumor was noted extending into the proximal ureteral area. A four prong Segura Nitinol basket was utilized to lasso a portion of the tumor. This was removed in its entirety. Further visualization demonstrated a large tumor filling the entire renal pelvis. The scope was unable to be advanced far beyond the upper outer aspect of the renal pelvis due to the degree of tumor. Minimal bleeding was encountered. Two additional biopsies with the basket were undertaken each with sizable portions of tumor and all appears to be papillary in nature consistent with transitional cell carcinoma. Washings were obtained from the renal pelvis. Several fragments of tissue were also noted within the urinary bladder after withdrawal of the scope. These were irrigated free and collected and will be sent with the pathology biopsy. The bladder was drained. The cystoscope was removed. The patient was returned to the supine position and awakened from general endotracheal anesthesia. She was taken to the recovery room in stable condition. There were no problems or complications. Patient tolerated procedure well.    ____________________________ Madolyn FriezeBrian S. Achilles Dunkope, MD bsc:cms D: 07/29/2012 09:44:41 ET T: 07/29/2012 10:02:50 ET JOB#: [id number]  cc: Madolyn FriezeBrian S. Achilles Dunkope, MD, <Dictator> Madolyn FriezeBRIAN S Lorene Klimas MD ELECTRONICALLY SIGNED 07/31/2012 21:45

## 2014-12-31 NOTE — Consult Note (Signed)
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  Megace has helpedwith the appetiteis under better controlhas persistent thrombocytopeni. No nausea.  No vomiting.  No diarrhea.  Ambulations remains limitedwas admitted in the hospital with uncontrolled pain.  Because of thrombocytopenia chemotherapy. being held  [facility]:  Comments Mother had ovarian cancer.   Father had cardiovascular disease   Social History negative alcohol, negative tobacco   Additional Past Medical and Surgical History As mentioned above.   History of hypertension  Angina.  Partial hysterectomy   Review of Systems:  General weakness  pain   Performance Status (ECOG) 3   HEENT no complaints   Lungs SOB   Cardiac no complaints   GI nausea  poor appetite   GU difficulty urinating   Musculoskeletal back pain   Extremities Swelling of her lower extremity   Skin no complaints   Neuro no complaints   Endocrine no complaints   Psych no complaints   NURSING NOTES: **Vital Signs.:   04-Mar-14 14:18   Vital Signs Type: Routine   Temperature Temperature (F): 98.4   Celsius: 36.8   Temperature Source: oral   Pulse Pulse: 78   Respirations Respirations: 18   Systolic BP Systolic BP: 092   Diastolic BP (mmHg) Diastolic BP (mmHg): 67   Mean BP: 86   Pulse Ox % Pulse Ox %: 100   Pulse Ox Activity Level: At rest   Oxygen Delivery: Room Air/ 21 %   Physical Exam:  General Patient is much more comfortable this afternoon   HEENT: normal   Lungs: crepitations   Cardiac: irregular rate, rhythm   Breast: not examined   Abdomen: soft  tender   Skin: intact   Extremities: edema   Neuro: AAOx3   Psych: alert and cooperative   Physical Exam LYMPHATICS:   No cervical,  axillary, or inguinal lymphadenopathy     Renal Cancer:    htn:    angina:    Cataract Extraction:    Appendectomy:    Hysterectomy - Partial:    Shellfish: Chest Tightness    oxycodone 5 mg oral capsule: 1-2  orally every 4 hours as needed  FOR PAIN, Status: Active, Quantity: 25, Refills: None   predniSONE 5 mg oral tablet: 12 tab(s) orally once a day, then decrease by 0.5 tablet daily until all the tablets are taken, Status: Active, Quantity: 20, Refills: None   clorazepate 7.5 mg oral tablet: 1 tab(s) orally every 12 hours, As Needed- for Anxiety, Nervousness , Status: Active, Quantity: 0, Refills: None   omeprazole 20 mg oral delayed release capsule: 1 cap(s) orally 2 times a day, Status: Active, Quantity: 0, Refills: None   Dilantin 100 mg oral capsule, extended release: 3 cap(s) orally once a day (at bedtime), Status: Active, Quantity: 90, Refills: None   metoprolol tartrate 100 mg oral tablet: 1 tab(s) orally 2 times a day, Status: Active, Quantity: 30, Refills: None   multivitamin: 1 tab(s) orally once a day, Status: Active, Quantity: 0, Refills: None   calcium carbonate 600 mg oral tablet: 1 tab(s) orally once a day, Status: Active, Quantity: 0, Refills: None   latanoprost ophthalmic 0.005% ophthalmic solution: 1 drop(s) to right eye once a day (at bedtime), Status: Active, Quantity: 0, Refills: None   aspirin 81 mg oral tablet: 1 tab(s) orally once  a day, Status: Active, Quantity: 0, Refills: None   fentanyl 50 mcg/hr transdermal film, extended release: 1 patch transdermal every 72 hours as directed for pain, Status: Active, Quantity: 0, Refills: None   ondansetron 4 mg oral tablet: 1 tab(s) orally every 4 to 6 hours, As Needed for nausea, Status: Active, Quantity: 0, Refills: None   megestrol 40 mg/mL oral suspension: 20 milliliter(s) orally once a day, Status: Active, Quantity: 600, Refills: None  Laboratory Results: Hepatic:  03-Mar-14 19:46   Bilirubin,  Total 0.4  Alkaline Phosphatase 103  SGPT (ALT) 28  SGOT (AST)  14  Total Protein, Serum 6.7  Albumin, Serum  2.5  Routine Chem:  03-Mar-14 19:46   Glucose, Serum 91  BUN 14  Creatinine (comp) 0.72  Sodium, Serum 137  Potassium, Serum 4.1  Chloride, Serum 106  CO2, Serum  20  Calcium (Total), Serum  8.2  Osmolality (calc) 274  eGFR (African American) >60  eGFR (Non-African American) >60 (eGFR values <35mL/min/1.73 m2 may be an indication of chronic kidney disease (CKD). Calculated eGFR is useful in patients with stable renal function. The eGFR calculation will not be reliable in acutely ill patients when serum creatinine is changing rapidly. It is not useful in  patients on dialysis. The eGFR calculation may not be applicable to patients at the low and high extremes of body sizes, pregnant women, and vegetarians.)  Anion Gap 11  Cardiac:  04-Mar-14 15:22   Troponin I 0.04 (0.00-0.05 0.05 ng/mL or less: NEGATIVE  Repeat testing in 3-6 hrs  if clinically indicated. >0.05 ng/mL: POTENTIAL  MYOCARDIAL INJURY. Repeat  testing in 3-6 hrs if  clinically indicated. NOTE: An increase or decrease  of 30% or more on serial  testing suggests a  clinically important change)  Routine UA:  03-Mar-14 21:44   Color (UA) Yellow  Clarity (UA) Hazy  Glucose (UA) Negative  Bilirubin (UA) Negative  Ketones (UA) 1+  Specific Gravity (UA) 1.013  Blood (UA) 3+  pH (UA) 7.0  Protein (UA) Negative  Nitrite (UA) Negative  Leukocyte Esterase (UA) Negative (Result(s) reported on 10 Nov 2012 at 10:16PM.)  RBC (UA) 140 /HPF  WBC (UA) 8 /HPF  Bacteria (UA) NONE SEEN  Epithelial Cells (UA) <1 /HPF (Result(s) reported on 10 Nov 2012 at 10:16PM.)  Routine Hem:  03-Mar-14 18:02   WBC (CBC) 7.6  RBC (CBC)  2.83  Hemoglobin (CBC)  9.8  Hematocrit (CBC)  29.3  Platelet Count (CBC)  66 (Result(s) reported on 10 Nov 2012 at 07:44PM.)  MCV  103  MCH  34.5  MCHC 33.4  RDW  18.9   Assessment  and Plan: Impression:   1.-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

## 2014-12-31 NOTE — Op Note (Signed)
PATIENT NAME:  Jennifer Kidd, Jennifer Kidd MR#:  [id number] DATE OF BIRTH:  08-03-1925  DATE OF PROCEDURE:  09/23/2012  PREOPERATIVE DIAGNOSIS: Kidney cancer.   POSTOPERATIVE DIAGNOSIS: Kidney cancer.   PROCEDURE PERFORMED: Insertion of central venous catheter with subcutaneous infusion port.   SURGEON: Renda RollsWilton Morocco Gipe, MD  ANESTHESIA: Local 1% Xylocaine with intravenous sedation and monitored anesthesia care.   INDICATIONS: This 81 year old female has a diagnosis of kidney cancer now needing central venous access for chemotherapy.   DESCRIPTION OF PROCEDURE: The patient was placed on the operating table, in the supine position, under intravenous sedation. A rolled sheet was placed behind the shoulder blades. The neck was extended. The neck and right subclavian areas were prepared with ChloraPrep and draped in a sterile manner.   The skin beneath the clavicle was infiltrated with 1% Xylocaine. A transversely oriented 3 cm incision was made, carried down through the subcutaneous tissues. Several small bleeding points were cauterized. A subcutaneous pouch was created anterior to the deep fascia, inferior to the incision, large enough to admit the PFM port. Subsequently, the jugular vein was identified with ultrasound. The carotid artery was identified. The skin overlying the jugular vein was infiltrated with 1% Xylocaine. A transversely oriented 6 mm incision was made and dissected down through subcutaneous tissues. Next, with the patient in the Trendelenburg position using ultrasound guidance, a needle was inserted and found that the guidewire would not thread down through the jugular vein. After several attempts, decision was made to move to an external jugular vein, which was identified with ultrasound, in the lateral aspect of the neck, on the right side. The skin overlying the external jugular vein was infiltrated with 1% Xylocaine. A transversely oriented 6 mm incision was made and using ultrasound guidance  needle was inserted into the external jugular vein, aspirated blood and inserted a guidewire. An image was saved with the ultrasound for the paper chart. Fluoroscopy was used to demonstrate location of the guidewire, in the vena cava. The dilator and introducer sheath were advanced over the guidewire. The dilator and guidewire were removed. The catheter was advanced down through the sheath and the sheath peeled away. The tip was positioned in the superior vena cava as seen with fluoroscopy and a fluoroscopic image was saved for the paper chart. Next, the catheter was tunneled down to the subclavian incision. Pressure was held over the tunnel site. The catheter was cut to fit and attached to the PFM port which was accessed with Demetrios IsaacsHuber needle, aspirated a trace of blood and flushed with 10 mL of saline. The port was placed into the subcutaneous pouch and sutured to the deep fascia with 4-0 silk. Next, the two cervical incisions were each closed with 5-0 Vicryl subcuticular suture. The subcutaneous tissues above the port were closed with 5-0 Vicryl and then the skin was closed with running 5-0 Vicryl subcuticular suture. All three incisions were treated with Dermabond. The patient tolerated the procedure satisfactorily and was prepared for transfer to the recovery room. ____________________________ Shela CommonsJ. Renda RollsWilton Nain Rudd, MD jws:sb D: 09/23/2012 13:25:47 ET T: 09/23/2012 13:51:15 ET JOB#: [id number]  cc: Adella HareJ. Wilton Prentiss Polio, MD, <Dictator> Adella HareWILTON J Celine Dishman MD ELECTRONICALLY SIGNED 09/26/2012 16:56

## 2014-12-31 NOTE — Consult Note (Signed)
   Comments   Christin Gusler, NP and I met with pt's grandson. He says that he has talked with patient and she really wants to go home. We again discussed idea of hospice involvement and he again confirms this is what he wants. Pt was seen by Melanie Crazier earlier today. Will relay plan to CM. All questions answered.  20 minutes   Electronic Signatures: Borders, Kirt Boys (NP)  (Signed 05-Mar-14 15:02)  Authored: Palliative Care   Last Updated: 05-Mar-14 15:02 by Irean Hong (NP)

## 2014-12-31 NOTE — H&P (Signed)
PATIENT NAME:  Jennifer Kidd, Monzerat G MR#:  [id number] DATE OF BIRTH:  [birth year]  DATE OF ADMISSION:  11/11/2012  PRIMARY CARE PHYSICIAN:  Aram BeechamJeffrey Sparks, MD  PRIMARY ONCOLOGIST:  Johney MaineJanak Choksi, MD   CHIEF COMPLAINT: Abdominal pain.   HISTORY OF PRESENT ILLNESS: This is an 81 year old female with significant past medical history of renal cell cancer diagnosed in 2013, being followed by Dr. Doylene Canninghoksi on chemotherapy, history of hypertension, hyperlipidemia, CVA, who presents with complaints of abdominal pain. The patient reports her abdominal pain is chronic where she is on p.o. oxycodone and hydrocodone and fentanyl patch at home, but reports her pain could not be controlled which prompted her to come to the Emergency Department. In ED, the patient received, a total of 2.5 mg of IV dye Dilaudid which currently controlled her pain and she appears comfortable. The patient was noticed to be tachycardic in the ED where she was in sinus tachycardia covered by  reviewing her old records and old visits. She always appears to be slightly tachycardic in the low 100s. There was a concern of PE given her history of cancer, but the patient was never hypoxic, never complained of chest pain or shortness of breath, and she has allergy to shellfish where she is currently being premedicated to receive CT of the chest with IV contrast to rule out PE. As well, the patient complains of generalized weakness, decreased appetite and not feeling well in general, the hospitalist service was requested to admit the patient for better pain control and hydration.   PAST MEDICAL HISTORY: 1.  Hypertension.  2.  Hyperlipidemia.  3.  Remote history of CVA.  4.  Adrenal cancer diagnosed in the fall of 2013. 5.  Has seizure disorder.  PAST SURGICAL HISTORY: 1.  Status post hysterectomy.  2.  Status post appendectomy.  3.  Recent Port-A-Cath insertion.   ALLERGIES:  SHELL FISH.  FAMILY HISTORY: Father died myocardial infarction at age  of 172.   SOCIAL HISTORY: The patient living by herself, reports currently that her granddaughter is staying with her to help,.  Denies any tobacco or alcohol abuse.   HOME MEDICATIONS: 1.  Prednisone tapering dose.  2.  Aspirin 81 mg oral daily.  3.  Fentanyl patch 50 mcg transdermal every 72 hours.  4.  Oxycodone 5 mg 1 to 2 tablets every 4 hours as needed for pain.  5.  Calcium carbonate 600 mg oral daily.  6.  Dilantin 100 mg extended release 3 capsules at bedtime.  7.  Zofran 4 mg tablet as needed every 6 hours.  8.  Megestrol 800 mg oral daily.  9.  Clorazepate 7.5 mg oral every 12 hours as needed for anxiety.  10.  Multivitamin 1 tablet oral daily.  11.  Latanoprost 0.005 ophthalmic solution right eye daily. 12.  Omeprazole 20 mg oral daily.  13.  Metoprolol 100 mg oral 2 times a day.   REVIEW OF SYSTEMS: CONSTITUTIONAL: The patient denies any fever, any chills. Complains of generalized weakness and fatigue. Reports weight loss over the last few weeks.  EYES: Denies blurry vision, double vision, pain, redness, inflammation.  ENT: Denies tinnitus, ear pain, hearing loss, epistaxis or discharge.  RESPIRATORY: Denies cough, wheezing, hemoptysis, dyspnea, painful respirations or COPD.  CARDIOVASCULAR: Denies chest pain, orthopnea, edema, arrhythmia, palpitations, syncope.  GASTROINTESTINAL: Denies nausea, vomiting, diarrhea, constipation. Has complaints of abdominal pain. Denies hematemesis, melena, rectal bleed, hemorrhoids or coffee-ground emesis.  GENITOURINARY: Denies dysuria, hematuria or renal colic.  ENDOCRINE:  Denies polyuria, polydipsia, heat or cold intolerance.  HEMATOLOGY: Denies easy bruising, bleeding diathesis. Has a history of anemia.  INTEGUMENTARY: Denies acne, rash or skin lesions.   MUSCULOSKELETAL: Complains of pain all over, left flank pain and abdominal pain.  Denies any gout, cramps.  NEUROLOGICAL: Reports history of seizures, history of CVA with no evidence  of neurologic deficits.  Denies any ataxia, dysarthria, tingling, numbness or focal deficits. Complains of generalized weakness.  PSYCHIATRIC: Has significant anxiety, nervousness. No history of substance or alcohol abuse, depression or schizophrenia.   PHYSICAL EXAMINATION: VITAL SIGNS: Temperature 98.4, pulse 122, respiratory rate 20, blood pressure 151/76, satting 7% on room air.  GENERAL: Frail, elderly female who is comfortable in bed in no apparent distress.  HEENT: Head is atraumatic, normocephalic. Pupils equal, reactive to light. Pink conjunctivae. Anicteric sclerae. Moist oral mucosa.  NECK: Supple. No thyromegaly. No JVD.  CHEST: Good air entry bilaterally. No wheezing, rales or rhonchi. Has right upper chest Port-A-Cath site that looks clean, no drainage or discharge.  CARDIOVASCULAR: S1, S2 heard. No rubs, murmurs or gallops.  ABDOMEN:  Very minimal tenderness but no rebound, no guarding. Bowel sounds present.  EXTREMITIES: No edema. No clubbing. No cyanosis.  PSYCHIATRIC: The patient appears to be anxious, overwhelmed, depressed but no suicidal thoughts, awake, alert x 3.  NEUROLOGIC: Cranial nerves grossly intact. Motor 5/5 in all extremities. Sensation is symmetrical.  SKIN: Normal skin turgor. Warm and dry.   PERTINENT LABORATORY AND DIAGNOSTIC DATA:  Glucose 91, BUN 14, creatinine 0.72, sodium 137, potassium 4.1, chloride 106, CO2 20 ALT 28, AST 14, alkaline phosphatase 103. White blood cells 7.6, hemoglobin 9.8, hematocrit 29.3, platelets 66. Urinalysis negative for leukocyte esterase and nitrite, showing 140 red blood cells and 8 white blood cells.  EKG is showing in sinus tachycardia with PVCs with ventricular rate of 111 with new T wave inversions noticed in leads III and V6.   ASSESSMENT AND PLAN: 1.  Abdominal pain/chronic pain syndrome, this is related to her renal cell carcinoma, which is metastatic. The patient's home medications appear to be poorly controlling her pain,  we will continue the patient on her fentanyl patch. Will increase the dosage to 75 mcg every 72 hours. Will have her while an inpatient on p.r.n. morphine.  We will consult palliative care for further assistance with pain management.  2.  Tachycardia, appears to be sinus tachycardia. By reviewing the patient's previous visits her baseline heart rate appears to be in the low 110s so this is not much higher than her baseline but appears to be worsened due to significant anxiety, as well some mild hydration and pain but given the fact she has renal cell cancer, we will obtain CT of the chest with intravenous contrast to rule out pulmonary embolus, where she is currently being premedicated for that.  3.  Renal cell carcinoma stage IV, metastatic. The patient is being followed by Dr. Doylene Canning. Will consult oncology service.  4.  Thrombocytopenia appears to be around her baseline. Will hold all chemical anticoagulation. The patient we will continue her on p.o. prednisone as an outpatient. Will resume the tapering dose as an inpatient.  5.  Hypertension. Blood pressure uncontrolled, most likely as well related to pain.  We will resume her on home medications.  6.  Hyperlipidemia. Continue with oral statin.  7.  Deep vein thrombosis prophylaxis with sequential compression devices. We will hold on chemical anticoagulation secondary to her thrombocytopenia.  8.  Tachycardia, EKG changes. The  patient had some minimal changes in her EKG mainly significant for mild T wave inversion in leads III and V6. We will cycle her troponins at this point. The patient denies any chest pain or shortness of breath.  9.  ETHICS:  The patient appears to be overwhelmed, cannot specify her CODE STATUS, but reports she is having a LIVING WILL with grandson.  We asked her to request her grandson to bring it. Meanwhile, she will be kept full code. The patient living by herself with her granddaughter currently staying with her. We will consult  palliative care for assistance with pain management as goal of care for the patient,.  As well will consult case management to see if we can arrange for some home care for her or placement.   Total time spent on admission and patient care: 55 minutes.    ____________________________ Starleen Arms, MD dse:ct D: 11/11/2012 02:41:26 ET T: 11/11/2012 09:43:07 ET JOB#: [id number]  cc: Starleen Arms, MD, <Dictator> DAWOOD Teena Irani MD ELECTRONICALLY SIGNED 11/12/2012 0:41

## 2014-12-31 NOTE — Discharge Summary (Signed)
PATIENT NAME:  Jennifer Kidd, Jennifer Kidd MR#:  [id number] DATE OF BIRTH:  Mar 06, 1925  DATE OF ADMISSION:  11/13/2012 DATE OF DISCHARGE:  11/17/2012  REASON FOR ADMISSION:  Abdominal pain.   HISTORY OF PRESENT ILLNESS:  Please see the dictated HPI done by Dr. Randol KernElgergawy on 11/11/2012.   PAST MEDICAL HISTORY: 1.  Benign hypertension.  2.  Hyperlipidemia.  3.  Previous stroke.  4.  Seizure disorder.  5.  Status post hysterectomy.  6.  Status post appendectomy.  7.  Metastatic renal cancer.   MEDICATIONS ON ADMISSION:  Please see admission note.   ALLERGIES:  SHELLFISH.   SOCIAL HISTORY, FAMILY HISTORY AND REVIEW OF SYSTEMS:  As per admission note.   PHYSICAL EXAMINATION: GENERAL:  The patient is elderly, chronically ill-appearing, in no acute distress. VITAL SIGNS:  Stable except for a heart rate of 122. HEENT:  Unremarkable. NECK:  Supple without JVD. LUNGS:  Clear. CARDIAC:  Revealed rapid rate with a regular rhythm. Normal S1 and S2. ABDOMEN:  Tender with no rebound or guarding. Normoactive bowel sounds. EXTREMITIES:  Without edema. NEUROLOGIC:  Grossly nonfocal.   HOSPITAL COURSE: The patient was admitted with acute on chronic abdominal pain associated with metastatic renal cell carcinoma. She was seen in consultation by oncology. Conservative measures were recommended. Her pain medication was adjusted. She was given some IV fluids. Her symptoms improved. She remained weak. Hospice consult was performed and the patient agreed to hospice care at home. The patient and did become more anemic with hydration. She required 2 units of packed red blood cells which were transfused without difficulty. Her tachycardia, resolved. By 11/17/2012, the patient was stable and ready for discharge.   DISCHARGE DIAGNOSES: 1.  Acute on chronic abdominal pain.  2.  Anemia of chronic disease.  3.  Metastatic renal cancer.  4.  Seizure disorder. 5.  Essential hypertension.  6.  Hyperlipidemia.  7.   Dehydration.   DISCHARGE MEDICATIONS: 1.  Tranxene 7.5 mg p.o. q. 12 hours p.r.n.  2.  Omeprazole 20 mg p.o. b.i.d.  3.  Dilantin 300 mg p.o. at bedtime.  4.  Lopressor 100 mg p.o. b.i.d. 5.  Multivitamin 1 p.o. daily.  6.  Os-Cal D one p.o. daily.  7.  Latanoprost eye drops as directed.  8.  Aspirin 81 mg p.o. daily.  9.  Zofran 4 mg p.o. q. 4 to 6 hours p.r.n. nausea.  10.  Megace 800 mg p.o. daily. 11.  Percocet 1 p.o. q. 4 hours p.r.n. pain.  12.  Duragesic 75 mcg topically q. 3 days.  13.  Restoril 15 mg p.o. at bedtime as needed for sleep.  14. Cardizem CD 180 mg p.o. daily.  FOLLOW-UP PLANS AND APPOINTMENTS:  The patient was discharged home on a regular diet. She will be followed by hospice. She will follow-up with me in 1 to 2 weeks, sooner if needed.     ____________________________ Duane LopeJeffrey D. Judithann SheenSparks, MD jds:ce D: May 12, 2013 13:06:04 ET T: May 12, 2013 13:35:19 ET JOB#: [id number]  cc: Duane LopeJeffrey D. Judithann SheenSparks, MD, <Dictator> Emmalynne Courtney Rodena Medin Daijon Wenke MD ELECTRONICALLY SIGNED September 29, 2012 15:05

## 2022-12-24 ENCOUNTER — Encounter: Payer: Self-pay | Admitting: *Deleted

## 2022-12-24 NOTE — Telephone Encounter (Signed)
Patient left a message that his insurance will not cover his Ozempic. I called [phone] and talked with [place]. She said that it was approved by the patient's insurance but his part was over $900. They applied the coupon that our office sent and it still left the patient owing $ 770.00 She was not given any substitutes .Do you have any other suggestions ?  Patient said that he only had 2 left.

## 2022-12-24 NOTE — Telephone Encounter (Signed)
Is this the correct patient?  I do not see where we have seen this patient before.

## 2022-12-26 NOTE — Telephone Encounter (Signed)
This encounter was created in error - please disregard.
